# Patient Record
Sex: Female | Born: 1995 | Race: Black or African American | Hispanic: No | Marital: Single | Smoking: Current every day smoker
Health system: Southern US, Community
[De-identification: ages and names within clinical notes are randomized; demographics above are authoritative.]

---
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
## 2014-07-27 ENCOUNTER — Encounter (HOSPITAL_BASED_OUTPATIENT_CLINIC_OR_DEPARTMENT_OTHER): Payer: Self-pay

## 2014-07-27 ENCOUNTER — Emergency Department (HOSPITAL_BASED_OUTPATIENT_CLINIC_OR_DEPARTMENT_OTHER): Payer: Medicaid Other

## 2014-07-27 ENCOUNTER — Emergency Department (HOSPITAL_BASED_OUTPATIENT_CLINIC_OR_DEPARTMENT_OTHER)
Admission: EM | Admit: 2014-07-27 | Discharge: 2014-07-27 | Disposition: A | Discharge status: Home / Self Care | Payer: Medicaid Other | Attending: Emergency Medicine | Admitting: Emergency Medicine

## 2014-07-27 DIAGNOSIS — J209 Acute bronchitis, unspecified: Secondary | ICD-10-CM | POA: Insufficient documentation

## 2014-07-27 DIAGNOSIS — R059 Cough, unspecified: Secondary | ICD-10-CM

## 2014-07-27 DIAGNOSIS — H9209 Otalgia, unspecified ear: Secondary | ICD-10-CM | POA: Insufficient documentation

## 2014-07-27 DIAGNOSIS — J40 Bronchitis, not specified as acute or chronic: Secondary | ICD-10-CM

## 2014-07-27 DIAGNOSIS — Z720 Tobacco use: Secondary | ICD-10-CM | POA: Diagnosis not present

## 2014-07-27 DIAGNOSIS — R05 Cough: Secondary | ICD-10-CM | POA: Diagnosis present

## 2014-07-27 DIAGNOSIS — E669 Obesity, unspecified: Secondary | ICD-10-CM | POA: Insufficient documentation

## 2014-07-27 MED ORDER — ALBUTEROL SULFATE HFA 108 (90 BASE) MCG/ACT IN AERS
2.0000 | INHALATION_SPRAY | Freq: Once | RESPIRATORY_TRACT | Status: AC
  Administered 2014-07-27: 2 via RESPIRATORY_TRACT
  Filled 2014-07-27: qty 6.7

## 2014-07-27 MED ORDER — ALBUTEROL SULFATE (2.5 MG/3ML) 0.083% IN NEBU: 5.0000 mg | INHALATION_SOLUTION | Freq: Once | RESPIRATORY_TRACT | Status: DC

## 2014-07-27 MED ORDER — ALBUTEROL SULFATE (2.5 MG/3ML) 0.083% IN NEBU
5.0000 mg | INHALATION_SOLUTION | Freq: Once | RESPIRATORY_TRACT | Status: AC
  Administered 2014-07-27: 5 mg via RESPIRATORY_TRACT
  Filled 2014-07-27: qty 6

## 2014-07-27 MED ORDER — AZITHROMYCIN 250 MG PO TABS: 250.0000 mg | ORAL_TABLET | Freq: Every day | ORAL | Status: DC

## 2014-07-27 MED ORDER — BENZONATATE 100 MG PO CAPS: 100.0000 mg | ORAL_CAPSULE | Freq: Three times a day (TID) | ORAL | Status: DC

## 2014-07-27 NOTE — ED Notes (Signed)
D/c with family- directed to pharmacy to pick up Rx

## 2014-07-27 NOTE — ED Notes (Signed)
Cough x 3 weeks.

## 2014-07-27 NOTE — Discharge Instructions (Signed)
Take azithromycin as directed for 5 days. He is albuterol inhaler every 4-6 hours as needed for cough and wheezing. Take Tessalon as directed for cough.  Cough, Adult  A cough is a reflex that helps clear your throat and airways. It can help heal the body or may be a reaction to an irritated airway. A cough may only last 2 or 3 weeks (acute) or may last more than 8 weeks (chronic).  CAUSES Acute cough:  Viral or bacterial infections. Chronic cough:  Infections.  Allergies.  Asthma.  Post-nasal drip.  Smoking.  Heartburn or acid reflux.  Some medicines.  Chronic lung problems (COPD).  Cancer. SYMPTOMS   Cough.  Fever.  Chest pain.  Increased breathing rate.  High-pitched whistling sound when breathing (wheezing).  Colored mucus that you cough up (sputum). TREATMENT   A bacterial cough may be treated with antibiotic medicine.  A viral cough must run its course and will not respond to antibiotics.  Your caregiver may recommend other treatments if you have a chronic cough. HOME CARE INSTRUCTIONS   Only take over-the-counter or prescription medicines for pain, discomfort, or fever as directed by your caregiver. Use cough suppressants only as directed by your caregiver.  Use a cold steam vaporizer or humidifier in your bedroom or home to help loosen secretions.  Sleep in a semi-upright position if your cough is worse at night.  Rest as needed.  Stop smoking if you smoke. SEEK IMMEDIATE MEDICAL CARE IF:   You have pus in your sputum.  Your cough starts to worsen.  You cannot control your cough with suppressants and are losing sleep.  You begin coughing up blood.  You have difficulty breathing.  You develop pain which is getting worse or is uncontrolled with medicine.  You have a fever. MAKE SURE YOU:   Understand these instructions.  Will watch your condition.  Will get help right away if you are not doing well or get worse. Document Released:  10/07/2010 Document Revised: 07/03/2011 Document Reviewed: 10/07/2010 [facility] Patient Information 2015 [place], [place]. This information is not intended to replace advice given to you by your health care provider. Make sure you discuss any questions you have with your health care provider. Acute Bronchitis Bronchitis is inflammation of the airways that extend from the windpipe into the lungs (bronchi). The inflammation often causes mucus to develop. This leads to a cough, which is the most common symptom of bronchitis.  In acute bronchitis, the condition usually develops suddenly and goes away over time, usually in a couple weeks. Smoking, allergies, and asthma can make bronchitis worse. Repeated episodes of bronchitis may cause further lung problems.  CAUSES Acute bronchitis is most often caused by the same virus that causes a cold. The virus can spread from person to person (contagious) through coughing, sneezing, and touching contaminated objects. SIGNS AND SYMPTOMS   Cough.   Fever.   Coughing up mucus.   Body aches.   Chest congestion.   Chills.   Shortness of breath.   Sore throat.  DIAGNOSIS  Acute bronchitis is usually diagnosed through a physical exam. Your health care provider will also ask you questions about your medical history. Tests, such as chest X-rays, are sometimes done to rule out other conditions.  TREATMENT  Acute bronchitis usually goes away in a couple weeks. Oftentimes, no medical treatment is necessary. Medicines are sometimes given for relief of fever or cough. Antibiotic medicines are usually not needed but may be prescribed in certain situations.  In some cases, an inhaler may be recommended to help reduce shortness of breath and control the cough. A cool mist vaporizer may also be used to help thin bronchial secretions and make it easier to clear the chest.  HOME CARE INSTRUCTIONS  Get plenty of rest.   Drink enough fluids to keep your urine  clear or pale yellow (unless you have a medical condition that requires fluid restriction). Increasing fluids may help thin your respiratory secretions (sputum) and reduce chest congestion, and it will prevent dehydration.   Take medicines only as directed by your health care provider.  If you were prescribed an antibiotic medicine, finish it all even if you start to feel better.  Avoid smoking and secondhand smoke. Exposure to cigarette smoke or irritating chemicals will make bronchitis worse. If you are a smoker, consider using nicotine gum or skin patches to help control withdrawal symptoms. Quitting smoking will help your lungs heal faster.   Reduce the chances of another bout of acute bronchitis by washing your hands frequently, avoiding people with cold symptoms, and trying not to touch your hands to your mouth, nose, or eyes.   Keep all follow-up visits as directed by your health care provider.  SEEK MEDICAL CARE IF: Your symptoms do not improve after 1 week of treatment.  SEEK IMMEDIATE MEDICAL CARE IF:  You develop an increased fever or chills.   You have chest pain.   You have severe shortness of breath.  You have bloody sputum.   You develop dehydration.  You faint or repeatedly feel like you are going to pass out.  You develop repeated vomiting.  You develop a severe headache. MAKE SURE YOU:   Understand these instructions.  Will watch your condition.  Will get help right away if you are not doing well or get worse. Document Released: 05/18/2004 Document Revised: 08/25/2013 Document Reviewed: 10/01/2012 [employer] [facility] Patient Information 2015 [place]. This information is not intended to replace advice given to you by your health care provider. Make sure you discuss any questions you have with your health care provider.

## 2014-07-27 NOTE — ED Notes (Signed)
PA at bedside.

## 2014-07-27 NOTE — ED Notes (Signed)
Patient transported to X-ray and returned 

## 2014-07-27 NOTE — ED Provider Notes (Signed)
CSN: [id number]     Arrival date & time 07/27/14  1224 History   First MD Initiated Contact with Patient 07/27/14 1227     Chief Complaint  Patient presents with  . Cough     (Consider location/radiation/quality/duration/timing/severity/associated sxs/prior Treatment) HPI Comments: 19 year old female presenting with cough  1 month. Cough is nonproductive, however states she feels like she needs to cough up mucus. Admits to associated nasal congestion and ear fullness. Denies fevers, wheezing, shortness of breath, nausea or vomiting. No sick contacts. She is a smoker. Tried OTC Nyquil with no relief.  Patient is a 19 y.o. female presenting with cough. The history is provided by the patient.  Cough Cough characteristics:  Non-productive Severity:  Moderate Onset quality:  Gradual Duration:  4 weeks Timing:  Constant Progression:  Worsening Smoker: yes   Relieved by:  Nothing Ineffective treatments: Nyquil. Associated symptoms: ear pain (fullness)     History reviewed. No pertinent past medical history. No past surgical history on file. No family history on file. History  Substance Use Topics  . Smoking status: Current Every Day Smoker  . Smokeless tobacco: Not on file  . Alcohol Use: No   OB History    No data available     Review of Systems  HENT: Positive for congestion and ear pain (fullness).   Respiratory: Positive for cough.   All other systems reviewed and are negative.     Allergies  Review of patient's allergies indicates no known allergies.  Home Medications   Prior to Admission medications   Medication Sig Start Date End Date Taking? Authorizing Provider  azithromycin (ZITHROMAX) 250 MG tablet Take 1 tablet (250 mg total) by mouth daily. Take first 2 tablets together, then 1 every day until finished. 07/27/14   Destinae Neubecker M Bradyn Soward, PA-C  benzonatate (TESSALON) 100 MG capsule Take 1 capsule (100 mg total) by mouth every 8 (eight) hours. 07/27/14   Merric Yost M Amee Boothe,  PA-C   BP 151/91 mmHg  Pulse 84  Temp(Src) 98.5 F (36.9 C) (Oral)  Resp 20  Wt 320 lb (145.151 kg)  SpO2 100%  LMP  (LMP Unknown) Physical Exam  Constitutional: She is oriented to person, place, and time. She appears well-developed and well-nourished. No distress.  Obese.  HENT:  Head: Normocephalic and atraumatic.  Mouth/Throat: Oropharynx is clear and moist.  Eyes: Conjunctivae and EOM are normal.  Neck: Normal range of motion. Neck supple.  Cardiovascular: Normal rate, regular rhythm and normal heart sounds.   Pulmonary/Chest: Effort normal. No respiratory distress.  Right sided end expiratory wheezes.  Musculoskeletal: Normal range of motion. She exhibits no edema.  Neurological: She is alert and oriented to person, place, and time. No sensory deficit.  Skin: Skin is warm and dry.  Psychiatric: She has a normal mood and affect. Her behavior is normal.  Nursing note and vitals reviewed.   ED Course  Procedures (including critical care time) Labs Review Labs Reviewed - No data to display  Imaging Review Dg Chest 2 View  07/27/2014   CLINICAL DATA:  Cough and congestion for 1 month, smoker  EXAM: CHEST  2 VIEW  COMPARISON:  06/08/2010  FINDINGS: Normal heart size, mediastinal contours and pulmonary vascularity.  Peribronchial thickening present.  No pulmonary infiltrate, pleural effusion or pneumothorax.  Osseous structures unremarkable.  IMPRESSION: Bronchitic changes without infiltrate.   Electronically Signed   By: Ulyses SouthwardMark  Boles M.D.   On: 07/27/2014 13:53     EKG Interpretation None  MDM   Final diagnoses:  Bronchitis  Cough   Nontoxic appearing, NAD. Afebrile, VSS. Will give albuterol nebulizer and obtain chest x-ray given one month of cough with no improvement, smoker and unilateral wheezing. 2:05 PM Chest x-ray showing bronchitic changes without infiltrate. Given that this has been ongoing for a month, will treat with azithromycin. We'll also discharge  home with Rx for Tessalon. Albuterol inhaler given. Significant improvement of wheezes after nebulizer. Stable for discharge. Return precautions given. Patient states understanding of treatment care plan and is agreeable.  Kathrynn Speed, PA-C 07/27/14 1405  Eber Hong, MD 07/27/14 2036

## 2015-05-08 ENCOUNTER — Encounter (HOSPITAL_COMMUNITY): Payer: Self-pay

## 2015-05-08 ENCOUNTER — Emergency Department (HOSPITAL_COMMUNITY)
Admission: EM | Admit: 2015-05-08 | Discharge: 2015-05-08 | Disposition: A | Discharge status: Home / Self Care | Payer: Medicaid Other | Attending: Emergency Medicine | Admitting: Emergency Medicine

## 2015-05-08 DIAGNOSIS — N939 Abnormal uterine and vaginal bleeding, unspecified: Secondary | ICD-10-CM | POA: Insufficient documentation

## 2015-05-08 DIAGNOSIS — F17200 Nicotine dependence, unspecified, uncomplicated: Secondary | ICD-10-CM | POA: Insufficient documentation

## 2015-05-08 DIAGNOSIS — B9689 Other specified bacterial agents as the cause of diseases classified elsewhere: Secondary | ICD-10-CM

## 2015-05-08 DIAGNOSIS — N760 Acute vaginitis: Secondary | ICD-10-CM | POA: Insufficient documentation

## 2015-05-08 LAB — BASIC METABOLIC PANEL
ANION GAP: 7 (ref 5–15)
BUN: 11 mg/dL (ref 6–20)
CHLORIDE: 105 mmol/L (ref 101–111)
CO2: 27 mmol/L (ref 22–32)
Calcium: 9.1 mg/dL (ref 8.9–10.3)
Creatinine, Ser: 0.59 mg/dL (ref 0.44–1.00)
GFR calc Af Amer: >60 mL/min (ref >60)
GLUCOSE: 115 mg/dL — AB (ref 65–99)
POTASSIUM: 3.7 mmol/L (ref 3.5–5.1)
SODIUM: 139 mmol/L (ref 135–145)

## 2015-05-08 LAB — I-STAT BETA HCG BLOOD, ED (MC, WL, AP ONLY)

## 2015-05-08 LAB — WET PREP, GENITAL
Sperm: NONE SEEN
TRICH WET PREP: NONE SEEN
WBC, Wet Prep HPF POC: NONE SEEN
YEAST WET PREP: NONE SEEN

## 2015-05-08 LAB — CBC
HCT: 35 % — ABNORMAL LOW (ref 36.0–46.0)
HEMOGLOBIN: 10.9 g/dL — AB (ref 12.0–15.0)
MCH: 23.8 pg — AB (ref 26.0–34.0)
MCHC: 31.1 g/dL (ref 30.0–36.0)
MCV: 76.4 fL — AB (ref 78.0–100.0)
PLATELETS: 503 10*3/uL — AB (ref 150–400)
RBC: 4.58 MIL/uL (ref 3.87–5.11)
RDW: 16.2 % — ABNORMAL HIGH (ref 11.5–15.5)
WBC: 10.5 10*3/uL (ref 4.0–10.5)

## 2015-05-08 MED ORDER — SODIUM CHLORIDE 0.9 % IV BOLUS (SEPSIS): 1000.0000 mL | Freq: Once | INTRAVENOUS | Status: DC

## 2015-05-08 MED ORDER — METRONIDAZOLE 500 MG PO TABS: 500.0000 mg | ORAL_TABLET | Freq: Two times a day (BID) | ORAL | Status: AC

## 2015-05-08 MED ORDER — FERROUS SULFATE 325 (65 FE) MG PO TABS: 325.0000 mg | ORAL_TABLET | Freq: Every day | ORAL | Status: AC

## 2015-05-08 NOTE — ED Notes (Signed)
She c/o vag. Bleeding x 2 months.  She states she typically has to change an oversize vag. Pad once per hour.  She also c/o some crampy lower abd. Pain at times.  She is in no distress.

## 2015-05-08 NOTE — Discharge Instructions (Signed)
Abnormal Uterine Bleeding °Abnormal uterine bleeding can affect women at various stages in life, including teenagers, women in their reproductive years, pregnant women, and women who have reached menopause. Several kinds of uterine bleeding are considered abnormal, including: °· Bleeding or spotting between periods.   °· Bleeding after sexual intercourse.   °· Bleeding that is heavier or more than normal.   °· Periods that last longer than usual. °· Bleeding after menopause.   °Many cases of abnormal uterine bleeding are minor and simple to treat, while others are more serious. Any type of abnormal bleeding should be evaluated by your health care provider. Treatment will depend on the cause of the bleeding. °HOME CARE INSTRUCTIONS °Monitor your condition for any changes. The following actions may help to alleviate any discomfort you are experiencing: °· Avoid the use of tampons and douches as directed by your health care provider. °· Change your pads frequently. °You should get regular pelvic exams and Pap tests. Keep all follow-up appointments for diagnostic tests as directed by your health care provider.  °SEEK MEDICAL CARE IF:  °· Your bleeding lasts more than 1 week.   °· You feel dizzy at times.   °SEEK IMMEDIATE MEDICAL CARE IF:  °· You pass out.   °· You are changing pads every 15 to 30 minutes.   °· You have abdominal pain. °· You have a fever.   °· You become sweaty or weak.   °· You are passing large blood clots from the vagina.   °· You start to feel nauseous and vomit. °MAKE SURE YOU:  °· Understand these instructions. °· Will watch your condition. °· Will get help right away if you are not doing well or get worse. °  °This information is not intended to replace advice given to you by your health care provider. Make sure you discuss any questions you have with your health care provider. °  °Document Released: 04/10/2005 Document Revised: 04/15/2013 Document Reviewed: 11/07/2012 °Elsevier Interactive  Patient Education ©2016 [employer] °Bacterial Vaginosis °Bacterial vaginosis is a vaginal infection that occurs when the normal balance of bacteria in the vagina is disrupted. It results from an overgrowth of certain bacteria. This is the most common vaginal infection in women of childbearing age. Treatment is important to prevent complications, especially in pregnant women, as it can cause a premature delivery. °CAUSES  °Bacterial vaginosis is caused by an increase in harmful bacteria that are normally present in smaller amounts in the vagina. Several different kinds of bacteria can cause bacterial vaginosis. However, the reason that the condition develops is not fully understood. °RISK FACTORS °Certain activities or behaviors can put you at an increased risk of developing bacterial vaginosis, including: °· Having a new sex partner or multiple sex partners. °· Douching. °· Using an intrauterine device (IUD) for contraception. °Women do not get bacterial vaginosis from toilet seats, bedding, swimming pools, or contact with objects around them. °SIGNS AND SYMPTOMS  °Some women with bacterial vaginosis have no signs or symptoms. Common symptoms include: °· Grey vaginal discharge. °· A fishlike odor with discharge, especially after sexual intercourse. °· Itching or burning of the vagina and vulva. °· Burning or pain with urination. °DIAGNOSIS  °Your health care provider will take a medical history and examine the vagina for signs of bacterial vaginosis. A sample of vaginal fluid may be taken. Your health care provider will look at this sample under a microscope to check for bacteria and abnormal cells. A vaginal pH test may also be done.  °TREATMENT  °Bacterial vaginosis may be treated   with antibiotic medicines. These may be given in the form of a pill or a vaginal cream. A second round of antibiotics may be prescribed if the condition comes back after treatment. Because bacterial vaginosis increases your risk for  sexually transmitted diseases, getting treated can help reduce your risk for chlamydia, gonorrhea, HIV, and herpes. °HOME CARE INSTRUCTIONS  °· Only take over-the-counter or prescription medicines as directed by your health care provider. °· If antibiotic medicine was prescribed, take it as directed. Make sure you finish it even if you start to feel better. °· Tell all sexual partners that you have a vaginal infection. They should see their health care provider and be treated if they have problems, such as a mild rash or itching. °· During treatment, it is important that you follow these instructions: °¨ Avoid sexual activity or use condoms correctly. °¨ Do not douche. °¨ Avoid alcohol as directed by your health care provider. °¨ Avoid breastfeeding as directed by your health care provider. °SEEK MEDICAL CARE IF:  °· Your symptoms are not improving after 3 days of treatment. °· You have increased discharge or pain. °· You have a fever. °MAKE SURE YOU:  °· Understand these instructions. °· Will watch your condition. °· Will get help right away if you are not doing well or get worse. °FOR MORE INFORMATION  °Centers for Disease Control and Prevention, Division of STD Prevention: www.cdc.gov/std °American Sexual Health Association (ASHA): www.ashastd.org  °  °This information is not intended to replace advice given to you by your health care provider. Make sure you discuss any questions you have with your health care provider. °  °Document Released: 04/10/2005 Document Revised: 05/01/2014 Document Reviewed: 11/20/2012 °Elsevier Interactive Patient Education ©2016 Elsevier Inc. ° °

## 2015-05-08 NOTE — ED Notes (Signed)
Patient can not recall the last time she had a normal period and this issue started 3 years ago. She has not seen her gynecologist since July and that was for a D&C. She no longer has insurance and has not been able to return.

## 2015-05-08 NOTE — ED Provider Notes (Signed)
CSN: [id number]     Arrival date & time 05/08/15  1345 History   First MD Initiated Contact with Patient 05/08/15 1459     Chief Complaint  Patient presents with  . Vaginal Bleeding     (Consider location/radiation/quality/duration/timing/severity/associated sxs/prior Treatment) HPI Comments: Patient presents to the ED with a chief complaint of vaginal bleeding.  She states that she has been bleeding constantly for the past 2 months.  States that the bleeding started at Thanksgiving.  States that she has been using 6 pads per day.  Reports having to get up in the night and shower. She states that she has started to feel very fatigued, tired, and worn out all of the time.  States that all she wants to do is sleep.  She reports mild crampy abdominal pain.  She states that she has been taking ibuprofen and aspirin for her symptoms with no relief.  There are no aggravating or alleviating factors.  Came to the ED because of increased fatigue and family's concern for anemia.  The history is provided by the patient. No language interpreter was used.    History reviewed. No pertinent past medical history. No past surgical history on file. No family history on file. Social History  Substance Use Topics  . Smoking status: Current Every Day Smoker  . Smokeless tobacco: None  . Alcohol Use: No   OB History    No data available     Review of Systems  Constitutional: Negative for fever and chills.  Respiratory: Negative for shortness of breath.   Cardiovascular: Negative for chest pain.  Gastrointestinal: Negative for nausea, vomiting, diarrhea and constipation.  Genitourinary: Positive for vaginal bleeding. Negative for dysuria.  All other systems reviewed and are negative.     Allergies  Review of patient's allergies indicates no known allergies.  Home Medications   Prior to Admission medications   Medication Sig Start Date End Date Taking? Authorizing Provider  ibuprofen  (ADVIL,MOTRIN) 200 MG tablet Take 200 mg by mouth every 6 (six) hours as needed for moderate pain.   Yes Historical Provider, MD   BP 141/76 mmHg  Pulse 83  Temp(Src) 98.1 F (36.7 C) (Oral)  Resp 18  SpO2 100% Physical Exam  Constitutional: She is oriented to person, place, and time. She appears well-developed and well-nourished.  obese  HENT:  Head: Normocephalic and atraumatic.  Eyes: Conjunctivae and EOM are normal. Pupils are equal, round, and reactive to light.  Neck: Normal range of motion. Neck supple.  Cardiovascular: Normal rate and regular rhythm.  Exam reveals no gallop and no friction rub.   No murmur heard. Pulmonary/Chest: Effort normal and breath sounds normal. No respiratory distress. She has no wheezes. She has no rales. She exhibits no tenderness.  Abdominal: Soft. Bowel sounds are normal. She exhibits no distension and no mass. There is no tenderness. There is no rebound and no guarding.  No focal abdominal tenderness, no RLQ tenderness or pain at McBurney's point, no RUQ tenderness or Murphy's sign, no left-sided abdominal tenderness, no fluid wave, or signs of peritonitis   Genitourinary:  Pelvic exam chaperoned by female ER tech, no right or left adnexal tenderness, no uterine tenderness, no vaginal discharge, mild dark red blood, no active hemorrhage, no clots, no CMT or friability, no foreign body, no injury to the external genitalia, no other significant findings   Musculoskeletal: Normal range of motion. She exhibits no edema or tenderness.  Neurological: She is alert and oriented to person,  place, and time.  Skin: Skin is warm and dry.  Psychiatric: She has a normal mood and affect. Her behavior is normal. Judgment and thought content normal.  Nursing note and vitals reviewed.   ED Course  Procedures (including critical care time) Results for orders placed or performed during the hospital encounter of 05/08/15  Wet prep, genital  Result Value Ref Range    Yeast Wet Prep HPF POC NONE SEEN NONE SEEN   Trich, Wet Prep NONE SEEN NONE SEEN   Clue Cells Wet Prep HPF POC PRESENT (A) NONE SEEN   WBC, Wet Prep HPF POC NONE SEEN NONE SEEN   Sperm NONE SEEN   CBC  Result Value Ref Range   WBC 10.5 4.0 - 10.5 K/uL   RBC 4.58 3.87 - 5.11 MIL/uL   Hemoglobin 10.9 (L) 12.0 - 15.0 g/dL   HCT 40.935.0 (L) 81.136.0 - 91.446.0 %   MCV 76.4 (L) 78.0 - 100.0 fL   MCH 23.8 (L) 26.0 - 34.0 pg   MCHC 31.1 30.0 - 36.0 g/dL   RDW 78.216.2 (H) 95.611.5 - 21.315.5 %   Platelets 503 (H) 150 - 400 K/uL  Basic metabolic panel  Result Value Ref Range   Sodium 139 135 - 145 mmol/L   Potassium 3.7 3.5 - 5.1 mmol/L   Chloride 105 101 - 111 mmol/L   CO2 27 22 - 32 mmol/L   Glucose, Bld 115 (H) 65 - 99 mg/dL   BUN 11 6 - 20 mg/dL   Creatinine, Ser 0.860.59 0.44 - 1.00 mg/dL   Calcium 9.1 8.9 - 57.810.3 mg/dL   GFR calc non Af Amer >60 >60 mL/min   GFR calc Af Amer >60 >60 mL/min   Anion gap 7 5 - 15  I-Stat Beta hCG blood, ED ([facility], [facility], [facility] only)  Result Value Ref Range   I-stat hCG, quantitative <5.0 <5 mIU/mL   Comment 3           No results found.  I have personally reviewed and evaluated these images and lab results as part of my medical decision-making.    MDM   Final diagnoses:  Abnormal uterine bleeding  BV (bacterial vaginosis)    Patient with vaginal bleeding x 2 months.  VSS.  Feels fatigued.  Will check HCG and CBC.  Hgb is 10.9, slightly lower than normal, but not such that emergent transfusion or hospitalization is indicated.  Orthostatic VS are normal.  VSS.  Patient is not in any apparent distress.  [facility].  No hemorrhage on my exam.  Clue cells seen on wet prep.  Will treat with metronidazole.  Will also give iron supplement.   Roxy Horsemanobert Avier Jech, PA-C 05/08/15 1655  Rolland PorterMark James, MD 05/18/15 [phone]

## 2015-05-10 LAB — GC/CHLAMYDIA PROBE AMP (~~LOC~~) NOT AT ARMC
CHLAMYDIA, DNA PROBE: NEGATIVE
NEISSERIA GONORRHEA: NEGATIVE

## 2015-06-28 ENCOUNTER — Encounter: Payer: Medicaid Other | Admitting: Obstetrics & Gynecology

## 2016-08-05 IMAGING — DX DG CHEST 2V
2 series · 2 of 2 positions shown · non-contrast
Comparison: 06/08/2010

CLINICAL DATA: Cough and congestion for 1 month, smoker

EXAM:
CHEST  2 VIEW

[chest pa]
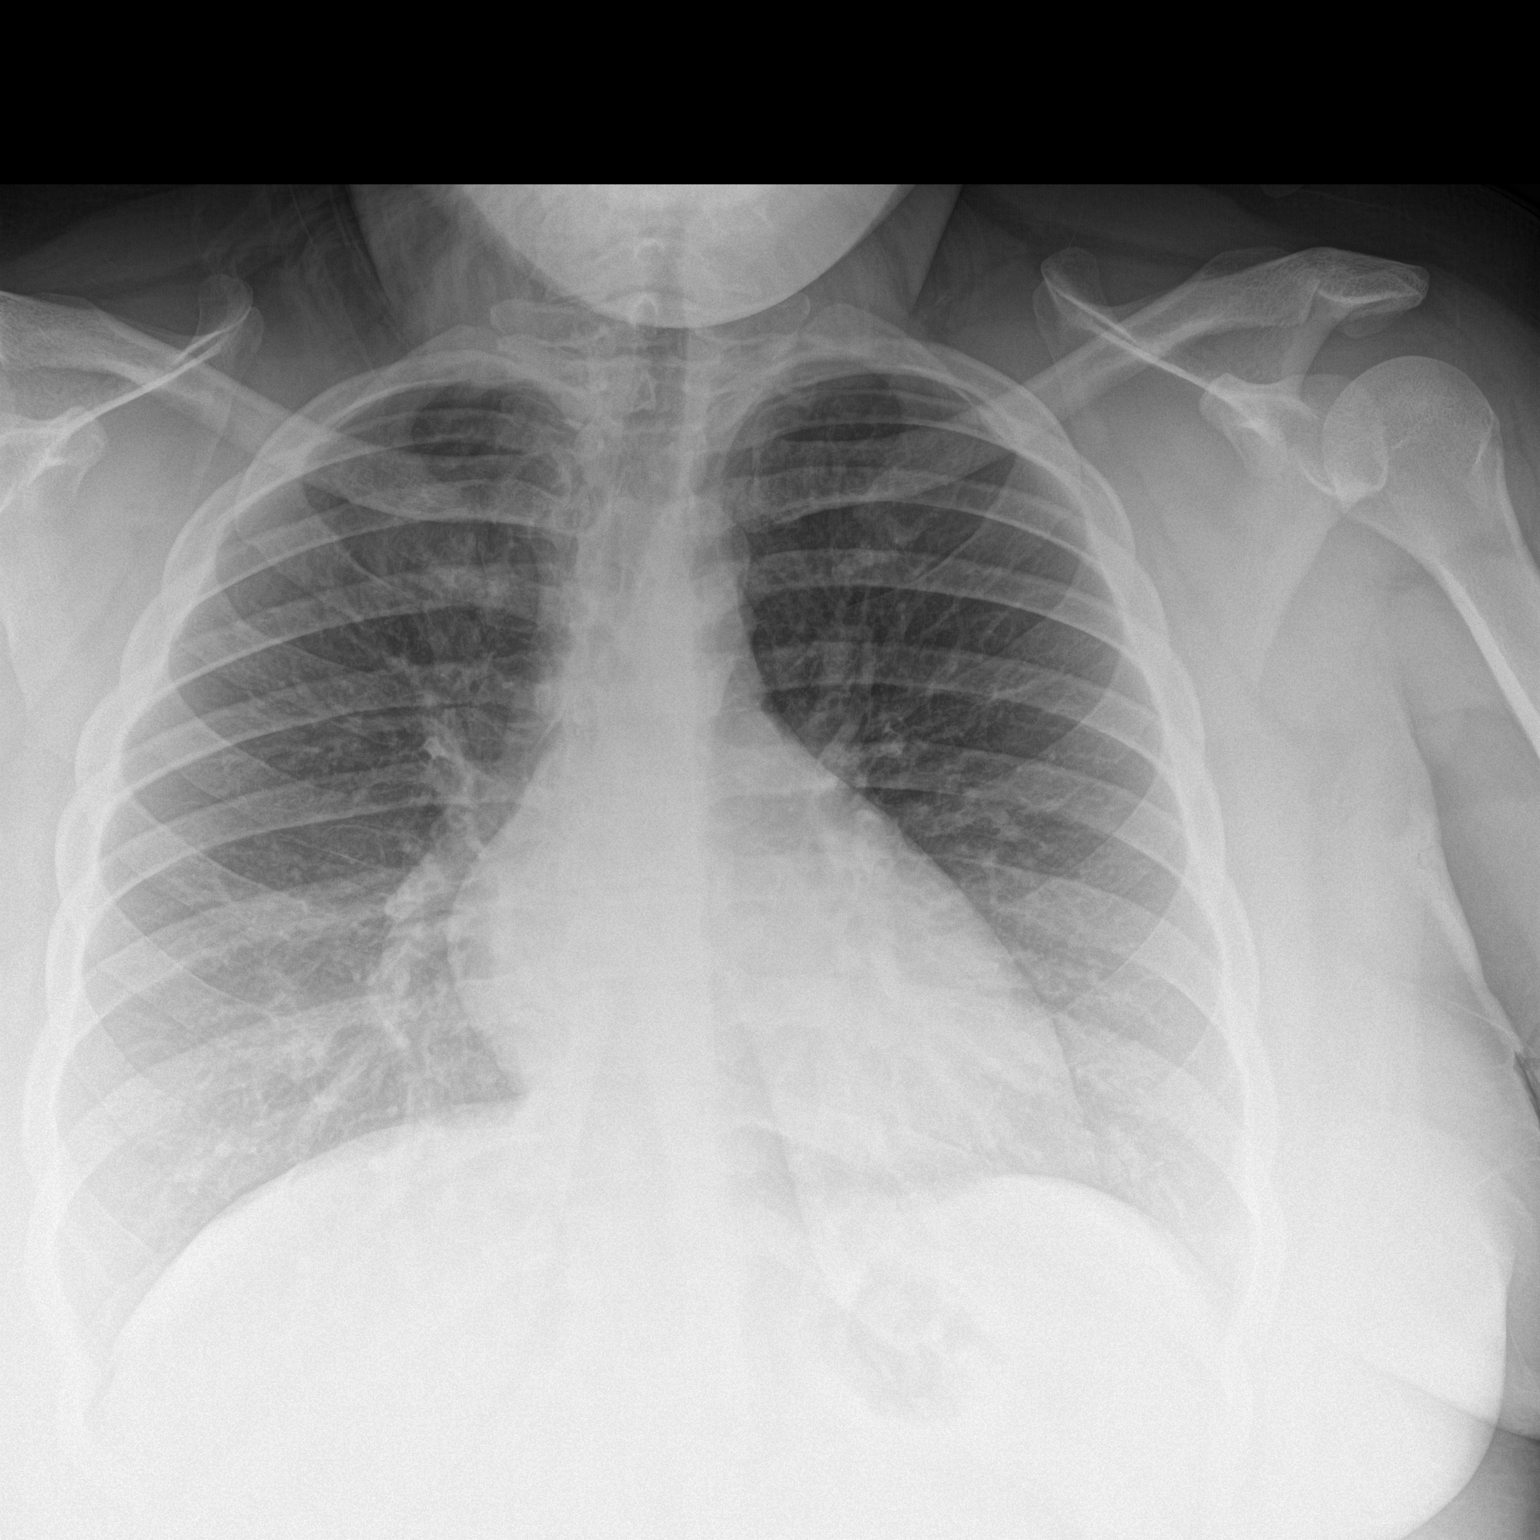

[chest lat]
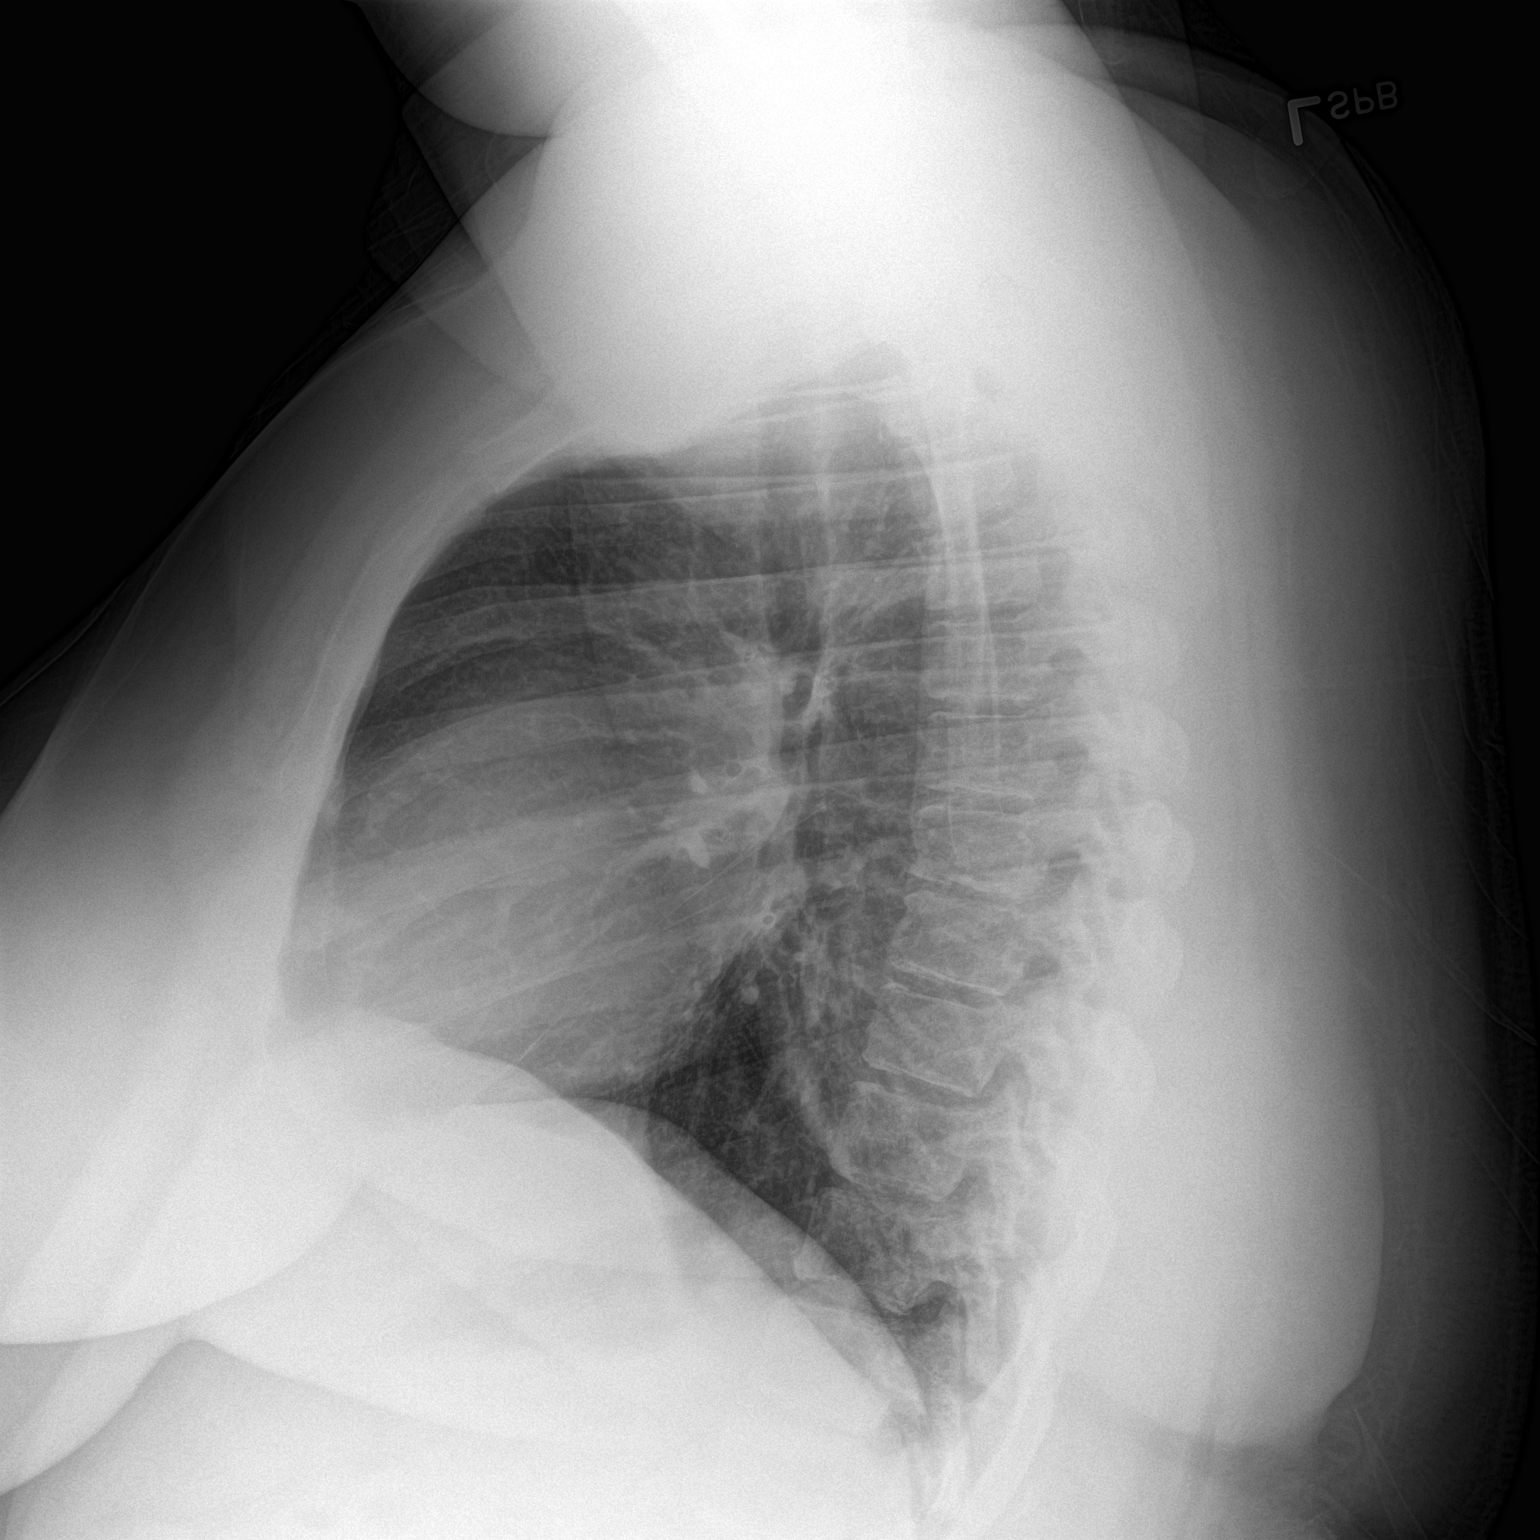

[2 of 2 positions shown; findings below may reference images not displayed]

FINDINGS: Normal heart size, mediastinal contours and pulmonary vascularity.

Peribronchial thickening present.

No pulmonary infiltrate, pleural effusion or pneumothorax.

Osseous structures unremarkable.
IMPRESSION: Bronchitic changes without infiltrate.
